# Patient Record
Sex: Female | Born: 1965
Health system: Southern US, Community
[De-identification: ages and names within clinical notes are randomized; demographics above are authoritative.]

---

## 2003-03-11 ENCOUNTER — Ambulatory Visit (HOSPITAL_COMMUNITY): Admission: RE | Admit: 2003-03-11 | Discharge: 2003-03-11 | Payer: Self-pay | Admitting: *Deleted

## 2003-05-08 ENCOUNTER — Ambulatory Visit (HOSPITAL_COMMUNITY): Admission: RE | Admit: 2003-05-08 | Discharge: 2003-05-08 | Payer: Self-pay | Admitting: *Deleted

## 2003-05-28 ENCOUNTER — Encounter: Admission: RE | Admit: 2003-05-28 | Discharge: 2003-05-28 | Payer: Self-pay | Admitting: *Deleted

## 2003-05-30 ENCOUNTER — Encounter: Admission: RE | Admit: 2003-05-30 | Discharge: 2003-05-30 | Payer: Self-pay | Admitting: *Deleted

## 2003-06-13 ENCOUNTER — Encounter: Admission: RE | Admit: 2003-06-13 | Discharge: 2003-06-13 | Payer: Self-pay | Admitting: Family Medicine

## 2003-06-20 ENCOUNTER — Encounter: Admission: RE | Admit: 2003-06-20 | Discharge: 2003-06-20 | Payer: Self-pay | Admitting: Family Medicine

## 2003-06-27 ENCOUNTER — Encounter: Admission: RE | Admit: 2003-06-27 | Discharge: 2003-06-27 | Payer: Self-pay | Admitting: Family Medicine

## 2003-07-04 ENCOUNTER — Encounter: Admission: RE | Admit: 2003-07-04 | Discharge: 2003-07-04 | Payer: Self-pay | Admitting: Family Medicine

## 2003-07-09 ENCOUNTER — Inpatient Hospital Stay (HOSPITAL_COMMUNITY): Admission: AD | Admit: 2003-07-09 | Discharge: 2003-07-11 | Payer: Self-pay | Admitting: *Deleted

## 2004-09-29 ENCOUNTER — Emergency Department (HOSPITAL_COMMUNITY): Admission: EM | Admit: 2004-09-29 | Discharge: 2004-09-29 | Payer: Self-pay | Admitting: Family Medicine

## 2005-05-25 ENCOUNTER — Encounter: Admission: RE | Admit: 2005-05-25 | Discharge: 2005-05-25 | Payer: Self-pay | Admitting: Specialist

## 2006-07-21 ENCOUNTER — Ambulatory Visit (HOSPITAL_COMMUNITY): Admission: RE | Admit: 2006-07-21 | Discharge: 2006-07-21 | Payer: Self-pay | Admitting: Obstetrics and Gynecology

## 2006-08-11 ENCOUNTER — Ambulatory Visit (HOSPITAL_COMMUNITY): Admission: RE | Admit: 2006-08-11 | Discharge: 2006-08-11 | Payer: Self-pay | Admitting: Obstetrics & Gynecology

## 2006-09-19 ENCOUNTER — Ambulatory Visit (HOSPITAL_COMMUNITY): Admission: RE | Admit: 2006-09-19 | Discharge: 2006-09-19 | Payer: Self-pay | Admitting: Gynecology

## 2006-11-08 ENCOUNTER — Ambulatory Visit: Payer: Self-pay | Admitting: Obstetrics and Gynecology

## 2006-11-08 ENCOUNTER — Inpatient Hospital Stay (HOSPITAL_COMMUNITY): Admission: AD | Admit: 2006-11-08 | Discharge: 2006-11-09 | Payer: Self-pay | Admitting: Obstetrics and Gynecology

## 2006-11-21 ENCOUNTER — Inpatient Hospital Stay (HOSPITAL_COMMUNITY): Admission: AD | Admit: 2006-11-21 | Discharge: 2006-11-21 | Payer: Self-pay | Admitting: Family Medicine

## 2009-05-22 ENCOUNTER — Encounter: Admission: RE | Admit: 2009-05-22 | Discharge: 2009-05-22 | Payer: Self-pay | Admitting: Family Medicine

## 2010-02-22 ENCOUNTER — Encounter: Payer: Self-pay | Admitting: *Deleted

## 2010-11-12 LAB — CBC
HCT: 36.8
HCT: 37
Hemoglobin: 12.5
Hemoglobin: 12.5
MCHC: 33.9
MCHC: 33.9
MCV: 83.1
MCV: 83.6
Platelets: 223
Platelets: 233
RBC: 4.43
RBC: 4.43
RDW: 14.2 — ABNORMAL HIGH
RDW: 14.4 — ABNORMAL HIGH
WBC: 10.5
WBC: 7.5

## 2010-11-12 LAB — RPR: RPR Ser Ql: NONREACTIVE

## 2011-06-18 ENCOUNTER — Other Ambulatory Visit: Payer: Self-pay | Admitting: Family Medicine

## 2011-06-18 DIAGNOSIS — Z1231 Encounter for screening mammogram for malignant neoplasm of breast: Secondary | ICD-10-CM

## 2011-07-08 ENCOUNTER — Ambulatory Visit
Admission: RE | Admit: 2011-07-08 | Discharge: 2011-07-08 | Disposition: A | Discharge status: Home / Self Care | Payer: 59 | Source: Ambulatory Visit | Attending: Family Medicine | Admitting: Family Medicine

## 2011-07-08 DIAGNOSIS — Z1231 Encounter for screening mammogram for malignant neoplasm of breast: Secondary | ICD-10-CM

## 2011-07-15 ENCOUNTER — Other Ambulatory Visit: Payer: Self-pay | Admitting: Family Medicine

## 2011-07-15 DIAGNOSIS — R928 Other abnormal and inconclusive findings on diagnostic imaging of breast: Secondary | ICD-10-CM

## 2011-07-22 ENCOUNTER — Ambulatory Visit
Admission: RE | Admit: 2011-07-22 | Discharge: 2011-07-22 | Disposition: A | Discharge status: Home / Self Care | Payer: 59 | Source: Ambulatory Visit | Attending: Family Medicine | Admitting: Family Medicine

## 2011-07-22 DIAGNOSIS — R928 Other abnormal and inconclusive findings on diagnostic imaging of breast: Secondary | ICD-10-CM

## 2012-08-02 ENCOUNTER — Other Ambulatory Visit (HOSPITAL_COMMUNITY)
Admission: RE | Admit: 2012-08-02 | Discharge: 2012-08-02 | Disposition: A | Discharge status: Home / Self Care | Payer: BC Managed Care – PPO | Source: Ambulatory Visit | Attending: Emergency Medicine | Admitting: Emergency Medicine

## 2012-08-02 ENCOUNTER — Encounter (HOSPITAL_COMMUNITY): Payer: Self-pay | Admitting: *Deleted

## 2012-08-02 ENCOUNTER — Emergency Department (HOSPITAL_COMMUNITY)
Admission: EM | Admit: 2012-08-02 | Discharge: 2012-08-02 | Disposition: A | Discharge status: Home / Self Care | Payer: BC Managed Care – PPO | Source: Home / Self Care

## 2012-08-02 DIAGNOSIS — N760 Acute vaginitis: Secondary | ICD-10-CM

## 2012-08-02 DIAGNOSIS — Z113 Encounter for screening for infections with a predominantly sexual mode of transmission: Secondary | ICD-10-CM | POA: Insufficient documentation

## 2012-08-02 LAB — POCT URINALYSIS DIP (DEVICE)
Bilirubin Urine: NEGATIVE
Glucose, UA: NEGATIVE mg/dL
Ketones, ur: NEGATIVE mg/dL
Leukocytes, UA: NEGATIVE
Nitrite: NEGATIVE
Protein, ur: NEGATIVE mg/dL
Specific Gravity, Urine: >=1.03 (ref 1.005–1.030)
Urobilinogen, UA: 0.2 mg/dL (ref 0.0–1.0)
pH: 6 (ref 5.0–8.0)

## 2012-08-02 MED ORDER — FLUCONAZOLE 150 MG PO TABS: 150.0000 mg | ORAL_TABLET | Freq: Every day | ORAL | Status: AC

## 2012-08-02 NOTE — ED Provider Notes (Signed)
History    CSN: [id number] Arrival date & time 08/02/12  1811  None    Chief Complaint  Patient presents with  . Vaginitis   (Consider location/radiation/quality/duration/timing/severity/associated sxs/prior Treatment) HPI  47 yo bf presents today with vaginal itching, light yellowish discharge for a few day.  Has been using triple antibiotic ointment without improvement of symptoms.  Denies fever, chills, abd pain, dysuria.  Question slight hematuria.  Thinks that she has a yeast infection.   History reviewed. No pertinent past medical history. History reviewed. No pertinent past surgical history. Family History  Problem Relation Age of Onset  . Bone cancer Mother   . Blindness Father    History  Substance Use Topics  . Smoking status: Never Smoker   . Smokeless tobacco: Not on file  . Alcohol Use: No   OB History   Grav Para Term Preterm Abortions TAB SAB Ect Mult Living                 Review of Systems  Constitutional: Negative.   HENT: Negative.   Eyes: Negative.   Endocrine: Negative.   Genitourinary: Positive for hematuria and vaginal discharge. Negative for dysuria, frequency, flank pain and pelvic pain.  Neurological: Negative.   Hematological: Negative.   Psychiatric/Behavioral: Negative.     Allergies  Review of patient's allergies indicates not on file.  Home Medications  No current outpatient prescriptions on file. BP 123/60  Pulse 69  Temp(Src) 98.3 F (36.8 C) (Oral)  Resp 16  SpO2 100%  LMP 08/01/2012 Physical Exam  Constitutional: She is oriented to person, place, and time. She appears well-developed and well-nourished.  HENT:  Head: Normocephalic and atraumatic.  Eyes: EOM are normal. Pupils are equal, round, and reactive to light.  Neck: Normal range of motion.  Cardiovascular: Normal rate and regular rhythm.   Pulmonary/Chest: Effort normal and breath sounds normal.  Abdominal: Soft. Bowel sounds are normal.  Genitourinary: Vaginal  discharge (brownish) found.  Musculoskeletal: Normal range of motion.  Neurological: She is alert and oriented to person, place, and time.  Skin: Skin is warm and dry.  Psychiatric: She has a normal mood and affect.    ED Course  Pelvic exam Date/Time: 08/02/2012 7:56 PM Performed by: Zonia Kief Authorized by: Leslee Home C Consent: Verbal consent obtained. Risks and benefits: risks, benefits and alternatives were discussed Consent given by: patient Patient understanding: patient states understanding of the procedure being performed Patient consent: the patient's understanding of the procedure matches consent given Procedure consent: procedure consent matches procedure scheduled Relevant documents: relevant documents present and verified Test results: test results available and properly labeled Site marked: the operative site was marked Imaging studies: imaging studies not available Required items: required blood products, implants, devices, and special equipment available Patient identity confirmed: verbally with patient Time out: Immediately prior to procedure a "time out" was called to verify the correct patient, procedure, equipment, support staff and site/side marked as required. Preparation: Patient was prepped and draped in the usual sterile fashion. Local anesthesia used: no Patient sedated: no Patient tolerance: Patient tolerated the procedure well with no immediate complications. Comments: Cultures done.  Brownish discharge around cervix.     (including critical care time) Labs Reviewed - No data to display No results found. No diagnosis found.  Assessment:  Possible yeast infection  MDM  Will treat with diflucan.  Patient will f/u in 3-5 days if symptoms worsen or not improved.  Return sooner if needed.  Voices  understanding.    Meds ordered this encounter  Medications  . fluconazole (DIFLUCAN) 150 MG tablet    Sig: Take 1 tablet (150 mg total) by mouth daily.     Dispense:  1 tablet    Refill:  0    Zonia Kief, PA-C 08/02/12 2000

## 2012-08-02 NOTE — ED Notes (Signed)
C/o vaginal itching onset Saturday and she thinks its a yeast infection.  She tried triple antibiotic without relief. C/o light yellowish vaginal discharge today.

## 2012-08-02 NOTE — ED Provider Notes (Signed)
Medical screening examination/treatment/procedure(s) were performed by non-physician practitioner and as supervising physician I was immediately available for consultation/collaboration.  Leslee Home, M.D.  Reuben Likes, MD 08/02/12 2032

## 2012-08-14 ENCOUNTER — Other Ambulatory Visit: Payer: Self-pay

## 2012-08-14 DIAGNOSIS — Z1231 Encounter for screening mammogram for malignant neoplasm of breast: Secondary | ICD-10-CM

## 2012-09-05 ENCOUNTER — Ambulatory Visit
Admission: RE | Admit: 2012-09-05 | Discharge: 2012-09-05 | Disposition: A | Discharge status: Home / Self Care | Payer: BC Managed Care – PPO | Source: Ambulatory Visit

## 2012-09-05 DIAGNOSIS — Z1231 Encounter for screening mammogram for malignant neoplasm of breast: Secondary | ICD-10-CM

## 2013-10-17 ENCOUNTER — Other Ambulatory Visit: Payer: Self-pay

## 2013-10-17 DIAGNOSIS — Z1231 Encounter for screening mammogram for malignant neoplasm of breast: Secondary | ICD-10-CM

## 2013-10-26 ENCOUNTER — Ambulatory Visit: Payer: BC Managed Care – PPO

## 2013-11-06 ENCOUNTER — Ambulatory Visit
Admission: RE | Admit: 2013-11-06 | Discharge: 2013-11-06 | Disposition: A | Discharge status: Home / Self Care | Payer: 59 | Source: Ambulatory Visit

## 2013-11-06 DIAGNOSIS — Z1231 Encounter for screening mammogram for malignant neoplasm of breast: Secondary | ICD-10-CM

## 2014-10-28 ENCOUNTER — Other Ambulatory Visit: Payer: Self-pay | Admitting: Gynecology

## 2014-10-29 LAB — CYTOLOGY - PAP

## 2015-07-23 ENCOUNTER — Ambulatory Visit: Payer: Self-pay | Admitting: Internal Medicine

## 2015-08-11 ENCOUNTER — Other Ambulatory Visit: Payer: Self-pay | Admitting: Gastroenterology

## 2015-12-30 IMAGING — MG MM SCREEN MAMMOGRAM BILATERAL
4 series · 4 of 4 positions shown · non-contrast
Comparison: Previous exam(s).

CLINICAL DATA: Screening.

EXAM:
DIGITAL SCREENING BILATERAL MAMMOGRAM WITH CAD

[R CC]
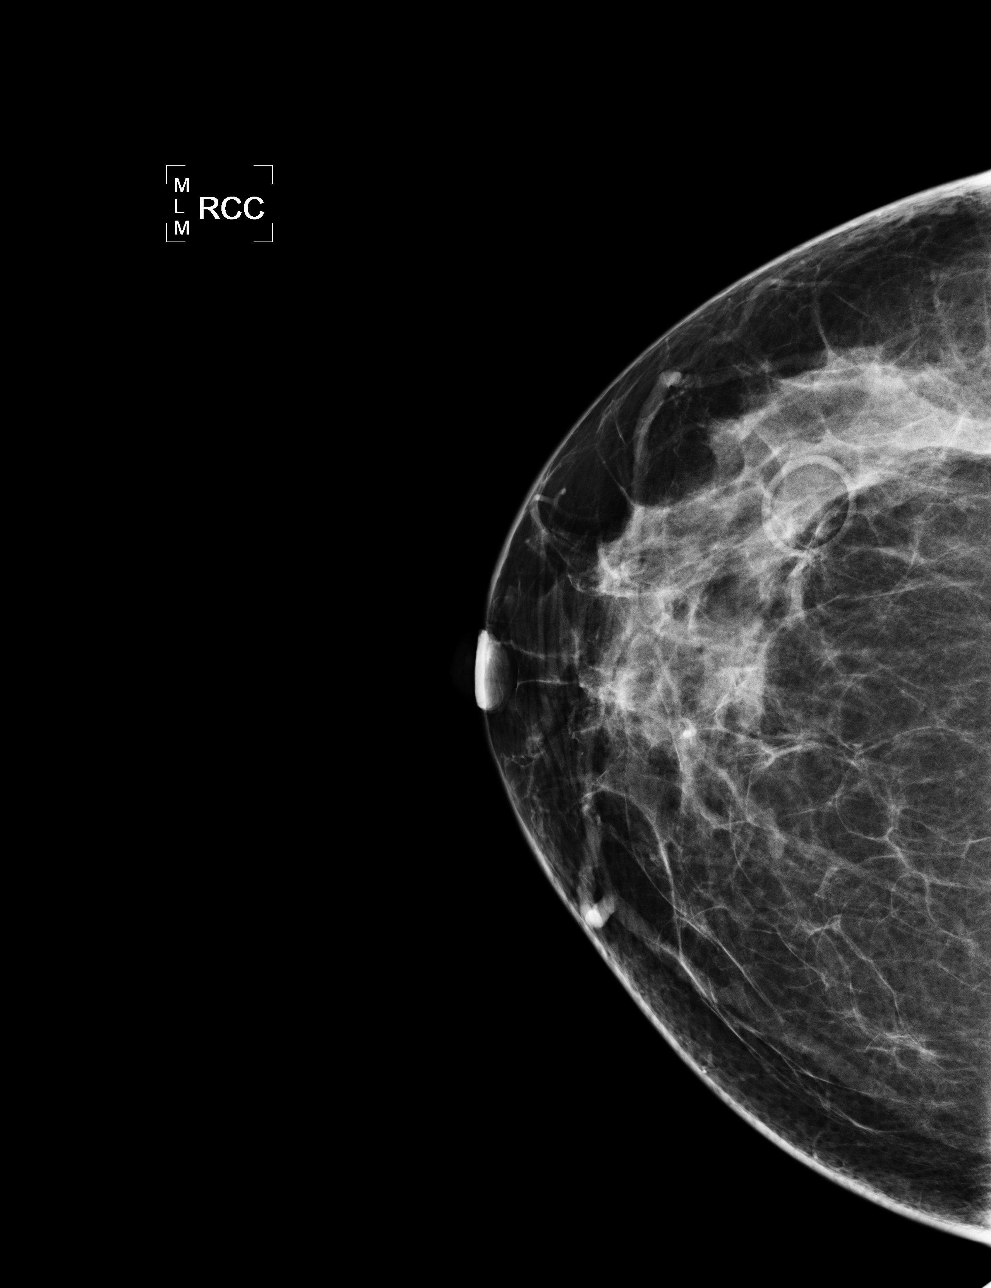

[L CC]
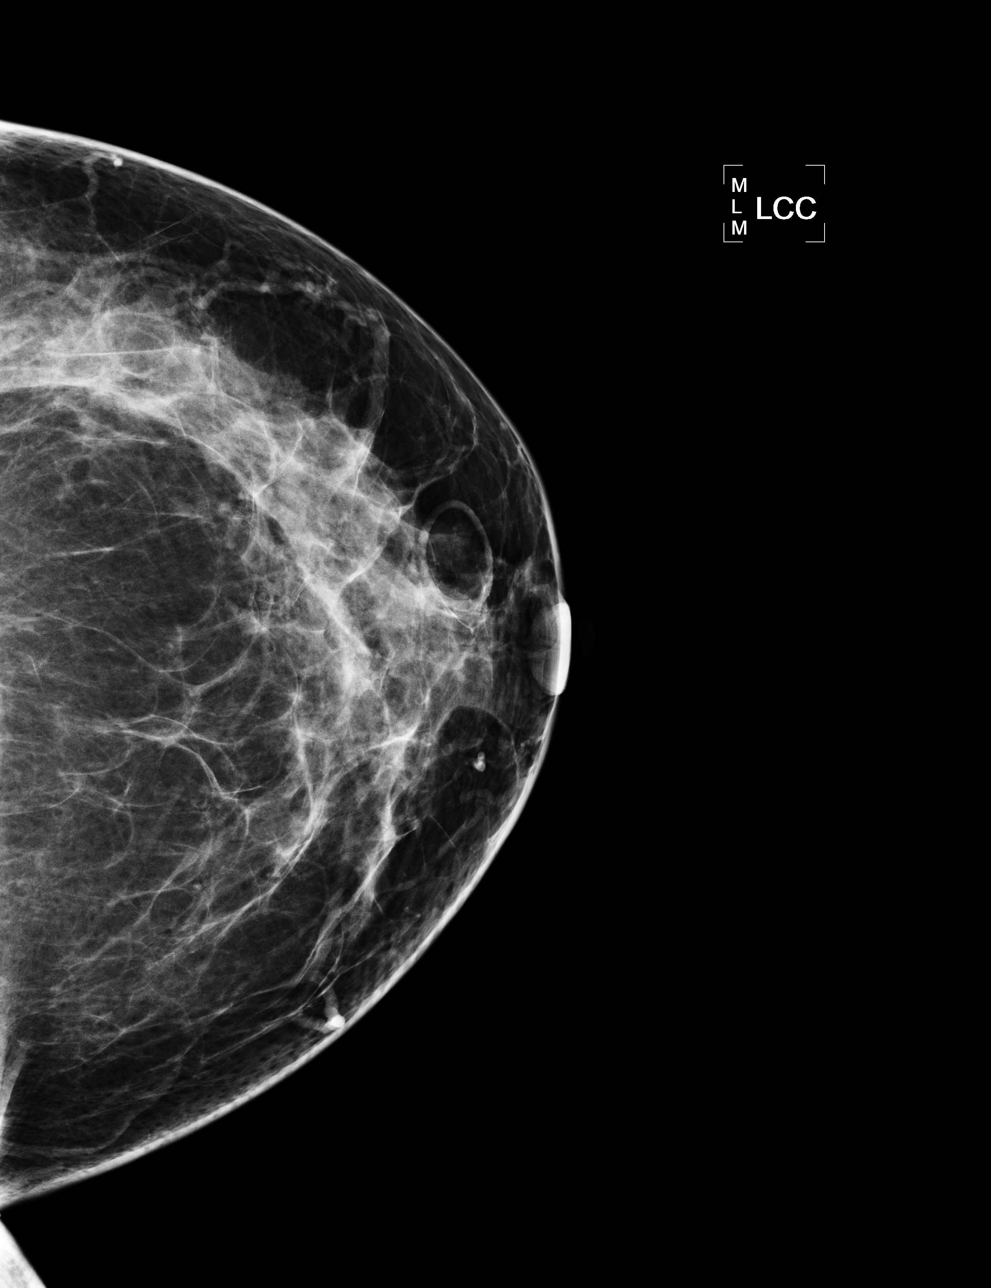

[L MLO]
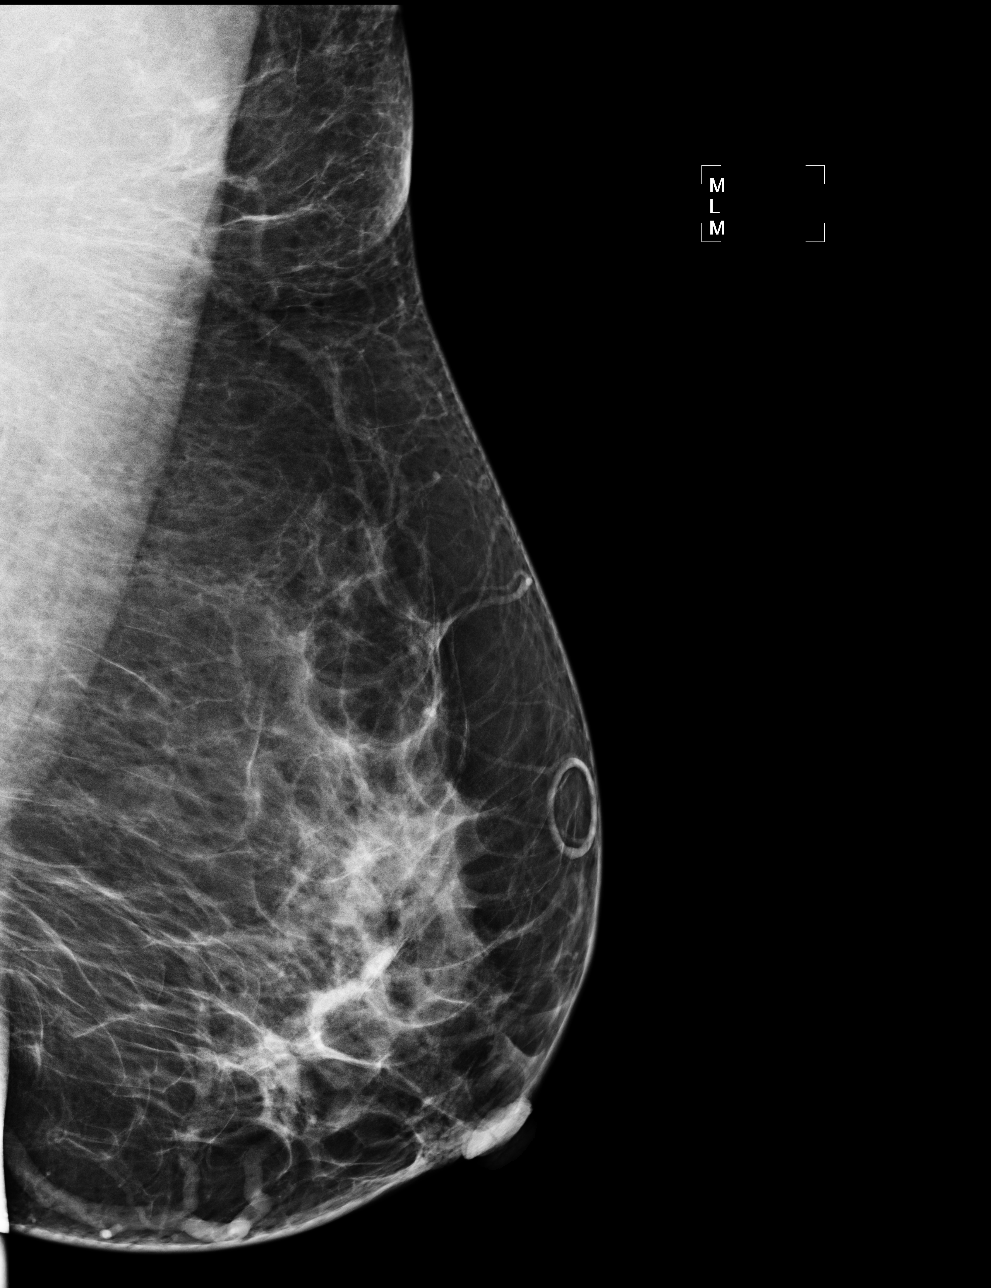

[R MLO]
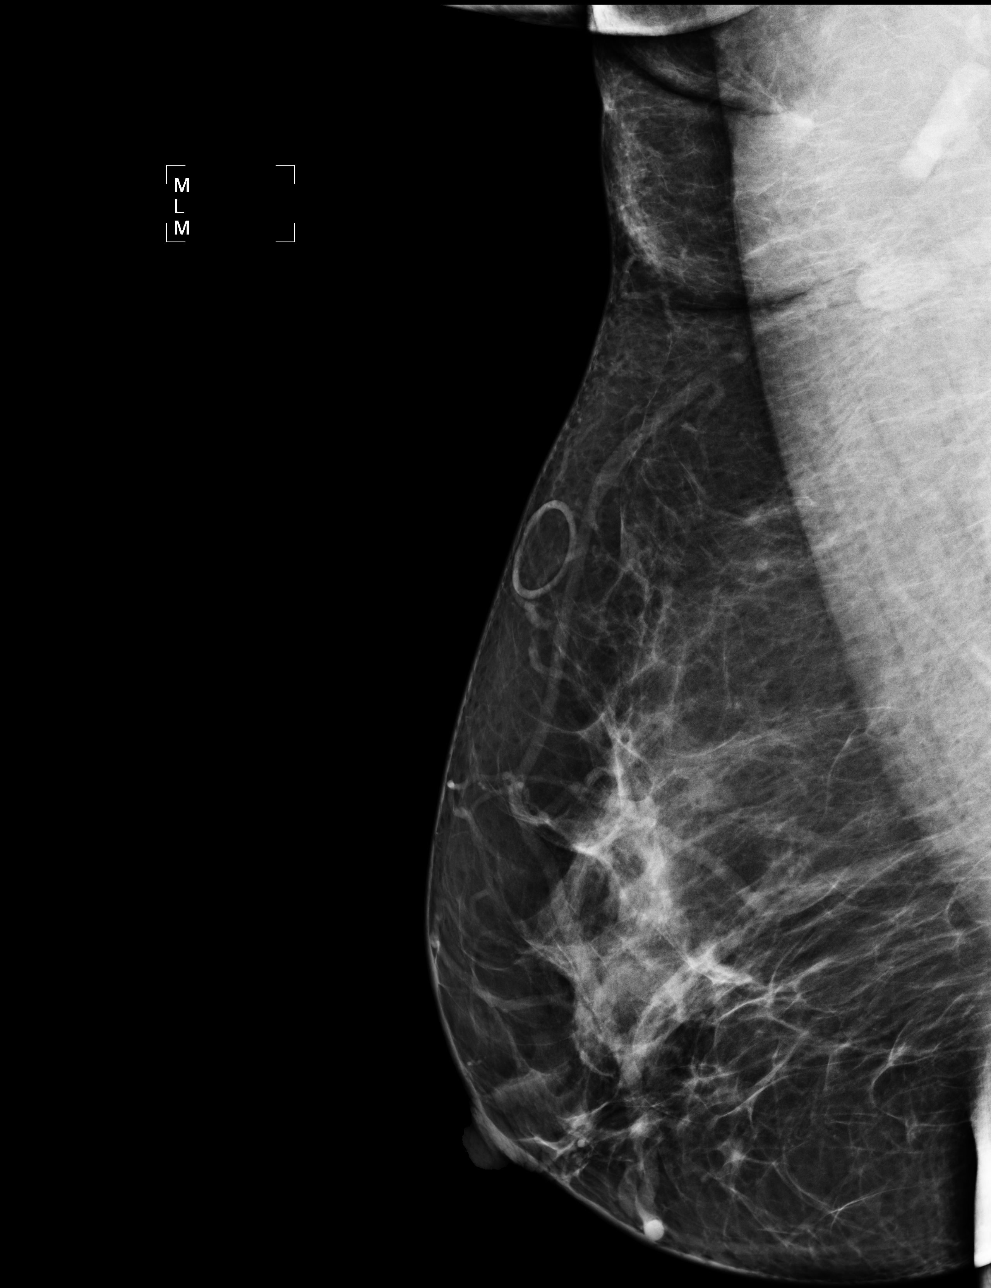

[4 of 4 positions shown; findings below may reference images not displayed]

ACR Breast Density Category b: There are scattered areas of
fibroglandular density.
FINDINGS: There are no findings suspicious for malignancy. Images were
processed with CAD.
IMPRESSION: No mammographic evidence of malignancy. A result letter of this
screening mammogram will be mailed directly to the patient.

RECOMMENDATION:
Screening mammogram in one year. (Code:AS-G-LCT)

BI-RADS CATEGORY  1: Negative.

## 2016-01-22 ENCOUNTER — Other Ambulatory Visit: Payer: Self-pay | Admitting: Vascular Surgery

## 2016-01-22 DIAGNOSIS — I872 Venous insufficiency (chronic) (peripheral): Secondary | ICD-10-CM

## 2016-02-05 ENCOUNTER — Other Ambulatory Visit: Payer: Self-pay | Admitting: Internal Medicine

## 2016-02-05 DIAGNOSIS — R7989 Other specified abnormal findings of blood chemistry: Secondary | ICD-10-CM | POA: Diagnosis not present

## 2016-02-05 DIAGNOSIS — I872 Venous insufficiency (chronic) (peripheral): Secondary | ICD-10-CM | POA: Diagnosis not present

## 2016-02-05 DIAGNOSIS — D892 Hypergammaglobulinemia, unspecified: Secondary | ICD-10-CM | POA: Diagnosis not present

## 2016-02-05 DIAGNOSIS — R7303 Prediabetes: Secondary | ICD-10-CM | POA: Diagnosis not present

## 2016-02-05 DIAGNOSIS — R945 Abnormal results of liver function studies: Secondary | ICD-10-CM

## 2016-02-17 ENCOUNTER — Ambulatory Visit
Admission: RE | Admit: 2016-02-17 | Discharge: 2016-02-17 | Disposition: A | Discharge status: Home / Self Care | Payer: Self-pay | Source: Ambulatory Visit | Attending: Internal Medicine | Admitting: Internal Medicine

## 2016-02-17 ENCOUNTER — Other Ambulatory Visit: Payer: Self-pay

## 2016-02-17 DIAGNOSIS — R945 Abnormal results of liver function studies: Secondary | ICD-10-CM

## 2016-02-17 DIAGNOSIS — R7989 Other specified abnormal findings of blood chemistry: Secondary | ICD-10-CM

## 2016-02-20 DIAGNOSIS — R739 Hyperglycemia, unspecified: Secondary | ICD-10-CM | POA: Diagnosis not present

## 2016-02-20 DIAGNOSIS — R945 Abnormal results of liver function studies: Secondary | ICD-10-CM | POA: Diagnosis not present

## 2016-02-27 ENCOUNTER — Ambulatory Visit
Admission: RE | Admit: 2016-02-27 | Discharge: 2016-02-27 | Disposition: A | Discharge status: Home / Self Care | Payer: BLUE CROSS/BLUE SHIELD | Source: Ambulatory Visit | Attending: Internal Medicine | Admitting: Internal Medicine

## 2016-03-02 ENCOUNTER — Encounter: Payer: Self-pay | Admitting: Vascular Surgery

## 2016-03-04 DIAGNOSIS — R7989 Other specified abnormal findings of blood chemistry: Secondary | ICD-10-CM | POA: Diagnosis not present

## 2016-03-04 DIAGNOSIS — D892 Hypergammaglobulinemia, unspecified: Secondary | ICD-10-CM | POA: Diagnosis not present

## 2016-03-09 ENCOUNTER — Ambulatory Visit (HOSPITAL_COMMUNITY)
Admission: RE | Admit: 2016-03-09 | Discharge: 2016-03-09 | Disposition: A | Discharge status: Home / Self Care | Payer: BLUE CROSS/BLUE SHIELD | Source: Ambulatory Visit | Attending: Vascular Surgery | Admitting: Vascular Surgery

## 2016-03-09 ENCOUNTER — Ambulatory Visit (INDEPENDENT_AMBULATORY_CARE_PROVIDER_SITE_OTHER): Payer: BLUE CROSS/BLUE SHIELD | Admitting: Vascular Surgery

## 2016-03-09 ENCOUNTER — Encounter: Payer: Self-pay | Admitting: Vascular Surgery

## 2016-03-09 VITALS — BP 119/75 | HR 62 | Temp 98.2°F | Resp 18 | Ht 63.5 in | Wt 158.7 lb

## 2016-03-09 DIAGNOSIS — I872 Venous insufficiency (chronic) (peripheral): Secondary | ICD-10-CM | POA: Insufficient documentation

## 2016-03-09 DIAGNOSIS — M7989 Other specified soft tissue disorders: Secondary | ICD-10-CM

## 2016-03-09 NOTE — Progress Notes (Signed)
[facility]  Patient name: Latasha Mcgee MRN: [id number] DOB: [birth year] Sex: female  REASON FOR CONSULT: Evaluation bilateral lower extremity with leg swelling  HPI: Latasha Mcgee is a 51 y.o. female, who is here today for evaluation of leg swelling. She is very pleasant female with progressive swelling in her distal calves and ankles. She has had some darkening of the skin around the same area. She has no history of DVT. She has attempted to wear her compression the past but has had discomfort associated with this and has not been persistent with this. He does worsen throughout the day. She was placed on diuretic which did improve her swelling but made her feel poorly and dehydrated and she discontinued this. No history of varicosities and no history of arterial insufficiency.  History reviewed. No pertinent past medical history.  Family History  Problem Relation Age of Onset  . Bone cancer Mother   . Blindness Father   . Heart disease Brother     SOCIAL HISTORY: Social History   Social History  . Marital status: Married    Spouse name: N/A  . Number of children: N/A  . Years of education: N/A   Occupational History  . Not on file.   Social History Main Topics  . Smoking status: Never Smoker  . Smokeless tobacco: Never Used  . Alcohol use No  . Drug use: No  . Sexual activity: Yes    Birth control/ protection: IUD   Other Topics Concern  . Not on file   Social History Narrative  . No narrative on file    No Known Allergies  Current Outpatient Prescriptions  Medication Sig Dispense Refill  . ibuprofen (ADVIL,MOTRIN) 600 MG tablet TK 1 T PO  EVERY 6-8 HOURS PRN FOR PAIN  0   No current facility-administered medications for this visit.     REVIEW OF SYSTEMS:  [X]  denotes positive finding, [ ]  denotes negative finding Cardiac  Comments:  Chest pain or chest pressure:    Shortness of breath  upon exertion:    Short of breath when lying flat:    Irregular heart rhythm:        Vascular    Pain in calf, thigh, or hip brought on by ambulation:    Pain in feet at night that wakes you up from your sleep:     Blood clot in your veins:    Leg swelling:  x       Pulmonary    Oxygen at home:    Productive cough:     Wheezing:         Neurologic    Sudden weakness in arms or legs:     Sudden numbness in arms or legs:     Sudden onset of difficulty speaking or slurred speech:    Temporary loss of vision in one eye:     Problems with dizziness:         Gastrointestinal    Blood in stool:     Vomited blood:         Genitourinary    Burning when urinating:     Blood in urine:        Psychiatric    Major depression:         Hematologic    Bleeding problems:    Problems with blood clotting too easily:        Skin    Rashes or ulcers:  Constitutional    Fever or chills:      PHYSICAL EXAM: Vitals:   03/09/16 1329  BP: 119/75  Pulse: 62  Resp: 18  Temp: 98.2 F (36.8 C)  TempSrc: Oral  SpO2: 100%  Weight: 158 lb 11.2 oz (72 kg)  Height: 5' 3.5" (1.613 m)    GENERAL: The patient is a well-nourished female, in no acute distress. The vital signs are documented above. CARDIOVASCULAR: 2+ dorsalis pedis pulses bilaterally. Mild to moderate swelling around both ankles with no pitting edema. Mild darkening of the skin around both ankles PULMONARY: There is good air exchange  ABDOMEN: Soft and non-tender  MUSCULOSKELETAL: There are no major deformities or cyanosis. NEUROLOGIC: No focal weakness or paresthesias are detected. SKIN: There are no ulcers or rashes noted. PSYCHIATRIC: The patient has a normal affect.  DATA:  Noninvasive study today was reviewed with the patient. This shows essentially normal venous study with no significant reflux in the superficial or deep venous systems. She has slight and significant reflux in her left common femoral vein  only  MEDICAL ISSUES: I discussed this at length with patient. Unclear as to the etiology of her lower from the swelling but cannot demonstrate any evidence of venous incompetence to explain this. Frustrated regarding this. I have explained that diuresis and elevation and compression are probably going to be the mainstay of treatment. She understands and will see [place] again on an as-needed basis   Larina Earthly, MD [facility] Tel [phone] Pager [phone]

## 2016-03-12 DIAGNOSIS — I872 Venous insufficiency (chronic) (peripheral): Secondary | ICD-10-CM | POA: Diagnosis not present

## 2016-03-12 DIAGNOSIS — R945 Abnormal results of liver function studies: Secondary | ICD-10-CM | POA: Diagnosis not present

## 2016-03-12 DIAGNOSIS — R7303 Prediabetes: Secondary | ICD-10-CM | POA: Diagnosis not present

## 2016-06-11 DIAGNOSIS — Z0000 Encounter for general adult medical examination without abnormal findings: Secondary | ICD-10-CM | POA: Diagnosis not present

## 2016-06-11 DIAGNOSIS — R7303 Prediabetes: Secondary | ICD-10-CM | POA: Diagnosis not present

## 2016-06-18 DIAGNOSIS — R002 Palpitations: Secondary | ICD-10-CM | POA: Diagnosis not present

## 2016-06-18 DIAGNOSIS — I872 Venous insufficiency (chronic) (peripheral): Secondary | ICD-10-CM | POA: Diagnosis not present

## 2016-06-18 DIAGNOSIS — R7303 Prediabetes: Secondary | ICD-10-CM | POA: Diagnosis not present

## 2016-06-18 DIAGNOSIS — Z0000 Encounter for general adult medical examination without abnormal findings: Secondary | ICD-10-CM | POA: Diagnosis not present

## 2017-01-03 DIAGNOSIS — Z1231 Encounter for screening mammogram for malignant neoplasm of breast: Secondary | ICD-10-CM | POA: Diagnosis not present

## 2017-01-03 DIAGNOSIS — Z01419 Encounter for gynecological examination (general) (routine) without abnormal findings: Secondary | ICD-10-CM | POA: Diagnosis not present

## 2017-01-03 DIAGNOSIS — R945 Abnormal results of liver function studies: Secondary | ICD-10-CM | POA: Diagnosis not present

## 2017-01-03 DIAGNOSIS — Z6829 Body mass index (BMI) 29.0-29.9, adult: Secondary | ICD-10-CM | POA: Diagnosis not present

## 2017-03-29 DIAGNOSIS — R7303 Prediabetes: Secondary | ICD-10-CM | POA: Diagnosis not present

## 2017-03-29 DIAGNOSIS — Z0000 Encounter for general adult medical examination without abnormal findings: Secondary | ICD-10-CM | POA: Diagnosis not present

## 2017-04-27 DIAGNOSIS — J060 Acute laryngopharyngitis: Secondary | ICD-10-CM | POA: Diagnosis not present

## 2017-06-03 DIAGNOSIS — R7303 Prediabetes: Secondary | ICD-10-CM | POA: Diagnosis not present

## 2017-06-20 DIAGNOSIS — Z23 Encounter for immunization: Secondary | ICD-10-CM | POA: Diagnosis not present

## 2017-06-21 DIAGNOSIS — R7303 Prediabetes: Secondary | ICD-10-CM | POA: Diagnosis not present

## 2017-06-21 DIAGNOSIS — I872 Venous insufficiency (chronic) (peripheral): Secondary | ICD-10-CM | POA: Diagnosis not present

## 2017-07-08 DIAGNOSIS — S81852A Open bite, left lower leg, initial encounter: Secondary | ICD-10-CM | POA: Diagnosis not present

## 2017-09-20 DIAGNOSIS — M542 Cervicalgia: Secondary | ICD-10-CM | POA: Diagnosis not present

## 2017-10-17 DIAGNOSIS — Z0000 Encounter for general adult medical examination without abnormal findings: Secondary | ICD-10-CM | POA: Diagnosis not present

## 2017-10-17 DIAGNOSIS — R7303 Prediabetes: Secondary | ICD-10-CM | POA: Diagnosis not present

## 2017-10-21 DIAGNOSIS — I872 Venous insufficiency (chronic) (peripheral): Secondary | ICD-10-CM | POA: Diagnosis not present

## 2017-10-21 DIAGNOSIS — Z0000 Encounter for general adult medical examination without abnormal findings: Secondary | ICD-10-CM | POA: Diagnosis not present

## 2017-10-21 DIAGNOSIS — R7303 Prediabetes: Secondary | ICD-10-CM | POA: Diagnosis not present

## 2018-01-18 DIAGNOSIS — Z01419 Encounter for gynecological examination (general) (routine) without abnormal findings: Secondary | ICD-10-CM | POA: Diagnosis not present

## 2018-01-18 DIAGNOSIS — Z6828 Body mass index (BMI) 28.0-28.9, adult: Secondary | ICD-10-CM | POA: Diagnosis not present

## 2018-01-18 DIAGNOSIS — Z1231 Encounter for screening mammogram for malignant neoplasm of breast: Secondary | ICD-10-CM | POA: Diagnosis not present

## 2018-03-20 DIAGNOSIS — R87610 Atypical squamous cells of undetermined significance on cytologic smear of cervix (ASC-US): Secondary | ICD-10-CM | POA: Diagnosis not present

## 2018-03-20 DIAGNOSIS — N870 Mild cervical dysplasia: Secondary | ICD-10-CM | POA: Diagnosis not present

## 2018-03-20 DIAGNOSIS — R87810 Cervical high risk human papillomavirus (HPV) DNA test positive: Secondary | ICD-10-CM | POA: Diagnosis not present

## 2018-03-20 DIAGNOSIS — N72 Inflammatory disease of cervix uteri: Secondary | ICD-10-CM | POA: Diagnosis not present

## 2018-05-04 DIAGNOSIS — R7303 Prediabetes: Secondary | ICD-10-CM | POA: Diagnosis not present

## 2018-05-11 DIAGNOSIS — R7303 Prediabetes: Secondary | ICD-10-CM | POA: Diagnosis not present

## 2018-05-11 DIAGNOSIS — I872 Venous insufficiency (chronic) (peripheral): Secondary | ICD-10-CM | POA: Diagnosis not present

## 2018-06-27 DIAGNOSIS — R071 Chest pain on breathing: Secondary | ICD-10-CM | POA: Diagnosis not present

## 2018-06-27 DIAGNOSIS — Z7189 Other specified counseling: Secondary | ICD-10-CM | POA: Diagnosis not present

## 2018-06-27 DIAGNOSIS — S20211A Contusion of right front wall of thorax, initial encounter: Secondary | ICD-10-CM | POA: Diagnosis not present

## 2018-06-27 DIAGNOSIS — R079 Chest pain, unspecified: Secondary | ICD-10-CM | POA: Diagnosis not present

## 2018-09-25 DIAGNOSIS — R87810 Cervical high risk human papillomavirus (HPV) DNA test positive: Secondary | ICD-10-CM | POA: Diagnosis not present

## 2018-10-18 DIAGNOSIS — Z0000 Encounter for general adult medical examination without abnormal findings: Secondary | ICD-10-CM | POA: Diagnosis not present

## 2018-11-02 DIAGNOSIS — R7303 Prediabetes: Secondary | ICD-10-CM | POA: Diagnosis not present

## 2018-11-02 DIAGNOSIS — I872 Venous insufficiency (chronic) (peripheral): Secondary | ICD-10-CM | POA: Diagnosis not present

## 2018-11-02 DIAGNOSIS — Z0000 Encounter for general adult medical examination without abnormal findings: Secondary | ICD-10-CM | POA: Diagnosis not present

## 2018-11-29 DIAGNOSIS — Z111 Encounter for screening for respiratory tuberculosis: Secondary | ICD-10-CM | POA: Diagnosis not present

## 2019-02-20 DIAGNOSIS — Z1231 Encounter for screening mammogram for malignant neoplasm of breast: Secondary | ICD-10-CM | POA: Diagnosis not present

## 2019-02-20 DIAGNOSIS — Z6829 Body mass index (BMI) 29.0-29.9, adult: Secondary | ICD-10-CM | POA: Diagnosis not present

## 2019-02-20 DIAGNOSIS — Z01419 Encounter for gynecological examination (general) (routine) without abnormal findings: Secondary | ICD-10-CM | POA: Diagnosis not present

## 2019-03-02 DIAGNOSIS — Z13820 Encounter for screening for osteoporosis: Secondary | ICD-10-CM | POA: Diagnosis not present

## 2019-05-25 DIAGNOSIS — R7303 Prediabetes: Secondary | ICD-10-CM | POA: Diagnosis not present

## 2019-05-31 DIAGNOSIS — I872 Venous insufficiency (chronic) (peripheral): Secondary | ICD-10-CM | POA: Diagnosis not present

## 2019-05-31 DIAGNOSIS — R7303 Prediabetes: Secondary | ICD-10-CM | POA: Diagnosis not present

## 2019-08-07 DIAGNOSIS — M25571 Pain in right ankle and joints of right foot: Secondary | ICD-10-CM | POA: Diagnosis not present

## 2019-08-07 DIAGNOSIS — M79671 Pain in right foot: Secondary | ICD-10-CM | POA: Diagnosis not present

## 2019-10-15 DIAGNOSIS — R87610 Atypical squamous cells of undetermined significance on cytologic smear of cervix (ASC-US): Secondary | ICD-10-CM | POA: Diagnosis not present

## 2019-10-15 DIAGNOSIS — Z0142 Encounter for cervical smear to confirm findings of recent normal smear following initial abnormal smear: Secondary | ICD-10-CM | POA: Diagnosis not present

## 2019-11-08 DIAGNOSIS — R7303 Prediabetes: Secondary | ICD-10-CM | POA: Diagnosis not present

## 2019-11-08 DIAGNOSIS — Z13220 Encounter for screening for lipoid disorders: Secondary | ICD-10-CM | POA: Diagnosis not present

## 2019-11-08 DIAGNOSIS — I872 Venous insufficiency (chronic) (peripheral): Secondary | ICD-10-CM | POA: Diagnosis not present

## 2019-11-08 DIAGNOSIS — Z0000 Encounter for general adult medical examination without abnormal findings: Secondary | ICD-10-CM | POA: Diagnosis not present

## 2019-11-15 DIAGNOSIS — I872 Venous insufficiency (chronic) (peripheral): Secondary | ICD-10-CM | POA: Diagnosis not present

## 2019-11-15 DIAGNOSIS — R7303 Prediabetes: Secondary | ICD-10-CM | POA: Diagnosis not present

## 2019-11-15 DIAGNOSIS — Z0000 Encounter for general adult medical examination without abnormal findings: Secondary | ICD-10-CM | POA: Diagnosis not present

## 2020-05-15 DIAGNOSIS — Z01419 Encounter for gynecological examination (general) (routine) without abnormal findings: Secondary | ICD-10-CM | POA: Diagnosis not present

## 2020-05-15 DIAGNOSIS — Z1231 Encounter for screening mammogram for malignant neoplasm of breast: Secondary | ICD-10-CM | POA: Diagnosis not present

## 2020-05-15 DIAGNOSIS — Z6827 Body mass index (BMI) 27.0-27.9, adult: Secondary | ICD-10-CM | POA: Diagnosis not present

## 2020-05-20 ENCOUNTER — Other Ambulatory Visit (HOSPITAL_COMMUNITY): Payer: Self-pay | Admitting: Obstetrics and Gynecology

## 2020-05-20 DIAGNOSIS — R011 Cardiac murmur, unspecified: Secondary | ICD-10-CM

## 2020-05-22 DIAGNOSIS — I872 Venous insufficiency (chronic) (peripheral): Secondary | ICD-10-CM | POA: Diagnosis not present

## 2020-05-22 DIAGNOSIS — R7303 Prediabetes: Secondary | ICD-10-CM | POA: Diagnosis not present

## 2020-05-29 DIAGNOSIS — R7303 Prediabetes: Secondary | ICD-10-CM | POA: Diagnosis not present

## 2020-05-29 DIAGNOSIS — I872 Venous insufficiency (chronic) (peripheral): Secondary | ICD-10-CM | POA: Diagnosis not present

## 2020-06-19 DIAGNOSIS — R87810 Cervical high risk human papillomavirus (HPV) DNA test positive: Secondary | ICD-10-CM | POA: Diagnosis not present

## 2020-06-19 DIAGNOSIS — N72 Inflammatory disease of cervix uteri: Secondary | ICD-10-CM | POA: Diagnosis not present

## 2020-06-19 DIAGNOSIS — N879 Dysplasia of cervix uteri, unspecified: Secondary | ICD-10-CM | POA: Diagnosis not present

## 2020-06-19 DIAGNOSIS — R87612 Low grade squamous intraepithelial lesion on cytologic smear of cervix (LGSIL): Secondary | ICD-10-CM | POA: Diagnosis not present

## 2020-06-20 ENCOUNTER — Ambulatory Visit (HOSPITAL_COMMUNITY): Admission: RE | Admit: 2020-06-20 | Payer: BC Managed Care – PPO | Source: Ambulatory Visit

## 2020-07-24 DIAGNOSIS — N870 Mild cervical dysplasia: Secondary | ICD-10-CM | POA: Diagnosis not present

## 2020-07-29 ENCOUNTER — Other Ambulatory Visit: Payer: Self-pay

## 2020-07-29 ENCOUNTER — Ambulatory Visit (HOSPITAL_COMMUNITY)
Admission: RE | Admit: 2020-07-29 | Discharge: 2020-07-29 | Disposition: A | Discharge status: Home / Self Care | Payer: BC Managed Care – PPO | Source: Ambulatory Visit | Attending: Obstetrics and Gynecology | Admitting: Obstetrics and Gynecology

## 2020-07-29 DIAGNOSIS — R011 Cardiac murmur, unspecified: Secondary | ICD-10-CM

## 2020-07-29 LAB — ECHOCARDIOGRAM COMPLETE
Area-P 1/2: 2.48 cm2
S' Lateral: 2.6 cm

## 2020-07-29 NOTE — Progress Notes (Signed)
  Echocardiogram 2D Echocardiogram has been performed.  Augustine Radar 07/29/2020, 11:02 AM

## 2020-09-15 DIAGNOSIS — D123 Benign neoplasm of transverse colon: Secondary | ICD-10-CM | POA: Diagnosis not present

## 2020-11-26 DIAGNOSIS — Z0000 Encounter for general adult medical examination without abnormal findings: Secondary | ICD-10-CM | POA: Diagnosis not present

## 2020-11-27 DIAGNOSIS — Z0000 Encounter for general adult medical examination without abnormal findings: Secondary | ICD-10-CM | POA: Diagnosis not present

## 2020-11-27 DIAGNOSIS — R7303 Prediabetes: Secondary | ICD-10-CM | POA: Diagnosis not present

## 2020-11-27 DIAGNOSIS — I872 Venous insufficiency (chronic) (peripheral): Secondary | ICD-10-CM | POA: Diagnosis not present

## 2021-01-15 DIAGNOSIS — D122 Benign neoplasm of ascending colon: Secondary | ICD-10-CM | POA: Diagnosis not present

## 2021-01-15 DIAGNOSIS — Z86010 Personal history of colonic polyps: Secondary | ICD-10-CM | POA: Diagnosis not present

## 2021-03-05 DIAGNOSIS — Z13820 Encounter for screening for osteoporosis: Secondary | ICD-10-CM | POA: Diagnosis not present

## 2021-06-04 DIAGNOSIS — I872 Venous insufficiency (chronic) (peripheral): Secondary | ICD-10-CM | POA: Diagnosis not present

## 2021-06-04 DIAGNOSIS — R7303 Prediabetes: Secondary | ICD-10-CM | POA: Diagnosis not present

## 2021-06-10 DIAGNOSIS — R7303 Prediabetes: Secondary | ICD-10-CM | POA: Diagnosis not present

## 2021-06-10 DIAGNOSIS — Z23 Encounter for immunization: Secondary | ICD-10-CM | POA: Diagnosis not present

## 2021-06-10 DIAGNOSIS — I872 Venous insufficiency (chronic) (peripheral): Secondary | ICD-10-CM | POA: Diagnosis not present

## 2021-08-11 DIAGNOSIS — Z124 Encounter for screening for malignant neoplasm of cervix: Secondary | ICD-10-CM | POA: Diagnosis not present

## 2021-08-11 DIAGNOSIS — Z1151 Encounter for screening for human papillomavirus (HPV): Secondary | ICD-10-CM | POA: Diagnosis not present

## 2021-08-11 DIAGNOSIS — Z6828 Body mass index (BMI) 28.0-28.9, adult: Secondary | ICD-10-CM | POA: Diagnosis not present

## 2021-08-11 DIAGNOSIS — Z01419 Encounter for gynecological examination (general) (routine) without abnormal findings: Secondary | ICD-10-CM | POA: Diagnosis not present

## 2021-08-11 DIAGNOSIS — Z1231 Encounter for screening mammogram for malignant neoplasm of breast: Secondary | ICD-10-CM | POA: Diagnosis not present

## 2021-10-19 DIAGNOSIS — H6993 Unspecified Eustachian tube disorder, bilateral: Secondary | ICD-10-CM | POA: Diagnosis not present

## 2021-11-26 DIAGNOSIS — Z0000 Encounter for general adult medical examination without abnormal findings: Secondary | ICD-10-CM | POA: Diagnosis not present

## 2021-12-03 DIAGNOSIS — Z0000 Encounter for general adult medical examination without abnormal findings: Secondary | ICD-10-CM | POA: Diagnosis not present

## 2021-12-03 DIAGNOSIS — Z23 Encounter for immunization: Secondary | ICD-10-CM | POA: Diagnosis not present

## 2021-12-03 DIAGNOSIS — I872 Venous insufficiency (chronic) (peripheral): Secondary | ICD-10-CM | POA: Diagnosis not present

## 2021-12-03 DIAGNOSIS — R7303 Prediabetes: Secondary | ICD-10-CM | POA: Diagnosis not present

## 2022-02-17 DIAGNOSIS — R87610 Atypical squamous cells of undetermined significance on cytologic smear of cervix (ASC-US): Secondary | ICD-10-CM | POA: Diagnosis not present

## 2022-02-17 DIAGNOSIS — Z1151 Encounter for screening for human papillomavirus (HPV): Secondary | ICD-10-CM | POA: Diagnosis not present

## 2022-02-17 DIAGNOSIS — R87810 Cervical high risk human papillomavirus (HPV) DNA test positive: Secondary | ICD-10-CM | POA: Diagnosis not present

## 2022-02-17 DIAGNOSIS — Z124 Encounter for screening for malignant neoplasm of cervix: Secondary | ICD-10-CM | POA: Diagnosis not present

## 2022-03-03 DIAGNOSIS — J040 Acute laryngitis: Secondary | ICD-10-CM | POA: Diagnosis not present

## 2022-05-20 DIAGNOSIS — I872 Venous insufficiency (chronic) (peripheral): Secondary | ICD-10-CM | POA: Diagnosis not present

## 2022-05-20 DIAGNOSIS — R7303 Prediabetes: Secondary | ICD-10-CM | POA: Diagnosis not present

## 2022-05-25 DIAGNOSIS — J3489 Other specified disorders of nose and nasal sinuses: Secondary | ICD-10-CM | POA: Diagnosis not present

## 2022-06-04 DIAGNOSIS — I872 Venous insufficiency (chronic) (peripheral): Secondary | ICD-10-CM | POA: Diagnosis not present

## 2022-06-04 DIAGNOSIS — R7303 Prediabetes: Secondary | ICD-10-CM | POA: Diagnosis not present

## 2022-08-17 DIAGNOSIS — R319 Hematuria, unspecified: Secondary | ICD-10-CM | POA: Diagnosis not present

## 2022-08-17 DIAGNOSIS — Z6829 Body mass index (BMI) 29.0-29.9, adult: Secondary | ICD-10-CM | POA: Diagnosis not present

## 2022-08-17 DIAGNOSIS — Z1231 Encounter for screening mammogram for malignant neoplasm of breast: Secondary | ICD-10-CM | POA: Diagnosis not present

## 2022-08-17 DIAGNOSIS — Z01419 Encounter for gynecological examination (general) (routine) without abnormal findings: Secondary | ICD-10-CM | POA: Diagnosis not present

## 2022-09-09 DIAGNOSIS — K639 Disease of intestine, unspecified: Secondary | ICD-10-CM | POA: Diagnosis not present

## 2022-10-12 DIAGNOSIS — R7303 Prediabetes: Secondary | ICD-10-CM | POA: Diagnosis not present

## 2022-10-21 DIAGNOSIS — R7303 Prediabetes: Secondary | ICD-10-CM | POA: Diagnosis not present

## 2022-11-23 DIAGNOSIS — I872 Venous insufficiency (chronic) (peripheral): Secondary | ICD-10-CM | POA: Diagnosis not present

## 2022-11-23 DIAGNOSIS — R7303 Prediabetes: Secondary | ICD-10-CM | POA: Diagnosis not present

## 2022-12-15 DIAGNOSIS — R7303 Prediabetes: Secondary | ICD-10-CM | POA: Diagnosis not present

## 2022-12-15 DIAGNOSIS — Z0000 Encounter for general adult medical examination without abnormal findings: Secondary | ICD-10-CM | POA: Diagnosis not present
